# Patient Record
Sex: Female | Born: 2000 | Hispanic: No | Marital: Single | Smoking: Current some day smoker
Health system: Southern US, Community
[De-identification: ages and names within clinical notes are randomized; demographics above are authoritative.]

## PROBLEM LIST (undated history)

## (undated) HISTORY — PX: TYMPANOSTOMY TUBE PLACEMENT: SHX32

## (undated) HISTORY — PX: ADENOIDECTOMY: SUR15

## (undated) HISTORY — PX: OTHER SURGICAL HISTORY: SHX169

---
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
## 2023-08-09 ENCOUNTER — Encounter (HOSPITAL_COMMUNITY): Payer: Self-pay | Admitting: *Deleted

## 2023-08-09 ENCOUNTER — Ambulatory Visit (HOSPITAL_COMMUNITY)
Admission: EM | Admit: 2023-08-09 | Discharge: 2023-08-09 | Disposition: A | Discharge status: Home / Self Care | Payer: Medicaid Other | Attending: Emergency Medicine | Admitting: Emergency Medicine

## 2023-08-09 ENCOUNTER — Other Ambulatory Visit: Payer: Self-pay

## 2023-08-09 DIAGNOSIS — N926 Irregular menstruation, unspecified: Secondary | ICD-10-CM

## 2023-08-09 DIAGNOSIS — Z113 Encounter for screening for infections with a predominantly sexual mode of transmission: Secondary | ICD-10-CM

## 2023-08-09 DIAGNOSIS — N898 Other specified noninflammatory disorders of vagina: Secondary | ICD-10-CM

## 2023-08-09 LAB — POCT URINE PREGNANCY: Preg Test, Ur: NEGATIVE

## 2023-08-09 NOTE — Discharge Instructions (Addendum)
We will call you if anything on your swab returns positive. You can also see these results on MyChart. Please abstain from sexual intercourse until your results return.  Please call the OB/GYN clinics to make an appointment for follow-up.  I would call first thing Monday morning to see when their first available is

## 2023-08-09 NOTE — ED Provider Notes (Signed)
[facility]    CSN: [id number] Arrival date & time: 08/09/23  1119     History   Chief Complaint Chief Complaint  Patient presents with   SEXUALLY TRANSMITTED DISEASE    HPI Heidi Kennedy is a 22 y.o. female.  Here for STD testing She reports some vaginal discharge.  Unprotected intercourse but unknown exposure. Denies abd pain or dysuria  Also reports she has had 3 menstrual cycles during this month.  They have lasted 4 to 7 days at a time She does not have a PCP or women's clinic  History reviewed. No pertinent past medical history.  There are no problems to display for this patient.   Past Surgical History:  Procedure Laterality Date   ADENOIDECTOMY     tubes in ears     TYMPANOSTOMY TUBE PLACEMENT      OB History   No obstetric history on file.      Home Medications    Prior to Admission medications   Not on File    Family History Family History  Problem Relation Age of Onset   Food Allergy Maternal Aunt    Urticaria Neg Hx    Immunodeficiency Neg Hx    Eczema Neg Hx    Allergic rhinitis Neg Hx    Angioedema Neg Hx    Asthma Neg Hx     Social History Social History   Tobacco Use   Smoking status: Some Days   Smokeless tobacco: Never  Substance Use Topics   Alcohol use: No   Drug use: No     Allergies   Patient has no known allergies.   Review of Systems Review of Systems Per HPI  Physical Exam Triage Vital Signs ED Triage Vitals  Encounter Vitals Group     BP 08/09/23 1209 112/74     Systolic BP Percentile --      Diastolic BP Percentile --      Pulse Rate 08/09/23 1209 93     Resp 08/09/23 1209 16     Temp 08/09/23 1209 98.6 F (37 C)     Temp src --      SpO2 08/09/23 1209 98 %     Weight --      Height --      Head Circumference --      Peak Flow --      Pain Score 08/09/23 1206 0     Pain Loc --      Pain Education --      Exclude from Growth Chart --    No data found.  Updated Vital Signs BP  112/74   Pulse 93   Temp 98.6 F (37 C)   Resp 16   LMP 08/01/2023 (Approximate)   SpO2 98%   Physical Exam Vitals and nursing note reviewed.  Constitutional:      General: She is not in acute distress.    Appearance: Normal appearance. She is not ill-appearing.  HENT:     Mouth/Throat:     Mouth: Mucous membranes are moist.     Pharynx: Oropharynx is clear.  Cardiovascular:     Rate and Rhythm: Normal rate and regular rhythm.     Pulses: Normal pulses.     Heart sounds: Normal heart sounds.  Pulmonary:     Effort: Pulmonary effort is normal.     Breath sounds: Normal breath sounds.  Abdominal:     Palpations: Abdomen is soft.     Tenderness: There is  no abdominal tenderness.  Skin:    General: Skin is warm and dry.     Coloration: Skin is not pale.  Neurological:     Mental Status: She is alert and oriented to person, place, and time.      UC Treatments / Results  Labs (all labs ordered are listed, but only abnormal results are displayed) Labs Reviewed  POCT URINE PREGNANCY  CERVICOVAGINAL ANCILLARY ONLY    EKG   Radiology No results found.  Procedures Procedures (including critical care time)  Medications Ordered in UC Medications - No data to display  Initial Impression / Assessment and Plan / UC Course  I have reviewed the triage vital signs and the nursing notes.  Pertinent labs & imaging results that were available during my care of the patient were reviewed by me and considered in my medical decision making (see chart for details).  UPT negative Cytology swab pending. Treat positive as indicated. Provided with two clinics for ob/gyn follow up Can return with any concerns  Patient agreeable to plan, no questions at this time  Final Clinical Impressions(s) / UC Diagnoses   Final diagnoses:  Screen for STD (sexually transmitted disease)  Vaginal discharge  Irregular menses     Discharge Instructions      We will call you if anything on  your swab returns positive. You can also see these results on MyChart. Please abstain from sexual intercourse until your results return.  Please call the OB/GYN clinics to make an appointment for follow-up.  I would call first thing Monday morning to see when their first available is    ED Prescriptions   None    PDMP not reviewed this encounter.   Ronen Bromwell, Lurena Joiner, [place] 08/09/23 1303

## 2023-08-09 NOTE — ED Triage Notes (Signed)
Pt reports she is here for her Girl's check up. Pt has had her menses 3 x this month.

## 2023-08-11 LAB — CERVICOVAGINAL ANCILLARY ONLY
Bacterial Vaginitis (gardnerella): POSITIVE — AB
Candida Glabrata: NEGATIVE
Candida Vaginitis: NEGATIVE
Chlamydia: POSITIVE — AB
Comment: NEGATIVE
Comment: NEGATIVE
Comment: NEGATIVE
Comment: NEGATIVE
Comment: NEGATIVE
Comment: NORMAL
Neisseria Gonorrhea: NEGATIVE
Trichomonas: NEGATIVE

## 2023-08-12 ENCOUNTER — Telehealth: Payer: Self-pay

## 2023-08-12 MED ORDER — METRONIDAZOLE 500 MG PO TABS: 500.0000 mg | ORAL_TABLET | Freq: Two times a day (BID) | ORAL | 0 refills | Status: AC

## 2023-08-12 MED ORDER — DOXYCYCLINE HYCLATE 100 MG PO CAPS: 100.0000 mg | ORAL_CAPSULE | Freq: Two times a day (BID) | ORAL | 0 refills | Status: AC

## 2023-09-10 ENCOUNTER — Ambulatory Visit: Payer: Medicaid Other

## 2023-10-02 ENCOUNTER — Ambulatory Visit: Payer: Medicaid Other | Admitting: Obstetrics and Gynecology

## 2024-03-12 ENCOUNTER — Emergency Department (HOSPITAL_COMMUNITY)
Admission: EM | Admit: 2024-03-12 | Discharge: 2024-03-12 | Disposition: A | Discharge status: Home / Self Care | Payer: MEDICAID | Attending: Emergency Medicine | Admitting: Emergency Medicine

## 2024-03-12 ENCOUNTER — Other Ambulatory Visit: Payer: Self-pay

## 2024-03-12 ENCOUNTER — Encounter (HOSPITAL_COMMUNITY): Payer: Self-pay

## 2024-03-12 DIAGNOSIS — R220 Localized swelling, mass and lump, head: Secondary | ICD-10-CM | POA: Diagnosis present

## 2024-03-12 DIAGNOSIS — L720 Epidermal cyst: Secondary | ICD-10-CM | POA: Diagnosis not present

## 2024-03-12 DIAGNOSIS — L729 Follicular cyst of the skin and subcutaneous tissue, unspecified: Secondary | ICD-10-CM

## 2024-03-12 NOTE — Discharge Instructions (Signed)
 Please follow-up with a dermatologist in regards to recent symptoms and ER visit.  As discussed it appears that you have a cyst on your forehead that will need to be drained by them.  You may take Tylenol or ibuprofen  every 6 hours needed for pain along with icing to help decrease the swelling.  If symptoms change or worsen please return to the ER.

## 2024-03-12 NOTE — ED Provider Notes (Signed)
 [place] EMERGENCY DEPARTMENT AT [facility] Provider Note   CSN: [id number] Arrival date & time: 03/12/24  1254     History  Chief Complaint  Patient presents with   Head Injury    Heidi Kennedy is a 23 y.o. female with no pertinent past medical history presented with a bump on her head since December.  Patient states that she hit her head on a door and has been evaluated in an outside facility and has been seen by her primary care provider states that she has a cyst on her forehead.  Patient denies any fevers, skin color changes, purulent material, discharge.  Patient states that she thinks it is getting bigger.  Patient does not take any medications for it.  Patient was encouraged to follow-up with dermatology however has not seen them yet.  Home Medications Prior to Admission medications   Medication Sig Start Date End Date Taking? Authorizing Provider  metroNIDAZOLE  (FLAGYL ) 500 MG tablet Take 1 tablet (500 mg total) by mouth 2 (two) times daily. 08/12/23   Lamptey, Donley Furth, MD      Allergies    Patient has no known allergies.    Review of Systems   Review of Systems  Physical Exam Updated Vital Signs BP 117/75 (BP Location: Left Arm)   Pulse 77   Temp 97.8 F (36.6 C) (Oral)   Resp 18   Ht 4\' 11"  (1.499 m)   Wt 43.1 kg   LMP 03/05/2024 (Approximate)   SpO2 100%   BMI 19.19 kg/m  Physical Exam Constitutional:      General: She is not in acute distress.    Comments: Resting comfortably on phone  HENT:     Head:     Comments: 1 cm simple cyst noted in the middle of patient's forehead that does not have any overlying warmth, skin color changes, discharge; cyst is rubbery ball-like in structure and nontender when I press Eyes:     Extraocular Movements: Extraocular movements intact.     Conjunctiva/sclera: Conjunctivae normal.     Pupils: Pupils are equal, round, and reactive to light.  Neurological:     Mental Status: She is alert.     ED  Results / Procedures / Treatments   Labs (all labs ordered are listed, but only abnormal results are displayed) Labs Reviewed - No data to display  EKG None  Radiology No results found.  Procedures Procedures    Medications Ordered in ED Medications - No data to display  ED Course/ Medical Decision Making/ A&P                                 Medical Decision Making  Heidi Kennedy 23 y.o. presented today for bump on forehead. Working DDx that I considered at this time includes, but not limited to, cyst, tumor, abscess.  R/o DDx: Tumor, abscess: These are considered less likely due to history of present illness, physical exam, labs/imaging findings  Review of prior external notes: 01/25/2024 office visit  Unique Tests and My Independent Interpretation: None  Social Determinants of Health: none  Discussion with Independent Historian: None  Discussion of Management of Tests: None  Risk: Low: based on diagnostic testing/clinical impression and treatment plan  Risk Stratification Score: None  Plan: On exam patient was no acute distress with stable vitals.  Patient's exam does show simple cyst as there is a rubber ball like structure in  the middle of her forehead without overlying skin color changes or signs of infection.  Cyst was nontender when I press.  Upon chart review of external records patient has seen a primary care provider and was referred to dermatology for further evaluation however has not been able to follow-up with them.  Patient I had shared decision making and agreed to forego any labs or imaging as we both agree this is not an infection and this would be not beneficial.  Will have patient follow-up with dermatology.  I did recommend anti-inflammatories such as Tylenol  or ibuprofen  every 6 hours along with ice to help with your symptoms.  I discussed with the patient that since she hit her head in December and she reports that she has been evaluated for this fall  and given physical exam do not believe she has any acute intracranial pathology to which patient agrees.  Patient states she is comfortable being discharged from triage.  Patient was given return precautions. Patient stable for discharge at this time.  Patient verbalized understanding of plan.  This chart was dictated using voice recognition software.  Despite best efforts to proofread,  errors can occur which can change the documentation meaning.         Final Clinical Impression(s) / ED Diagnoses Final diagnoses:  Cyst of skin    Rx / DC Orders ED Discharge Orders          Ordered    Ambulatory referral to Dermatology        03/12/24 1513              Zhaire Locker T, PA-C 03/12/24 1518    Nicklas Barns, MD 03/15/24 [phone]

## 2024-03-12 NOTE — ED Triage Notes (Signed)
 Pt states she was hit with a door on forehead 4 months ago. Pt states she had a knot on forehead then but since it has gotten bigger and more painful.

## 2024-03-30 ENCOUNTER — Emergency Department (HOSPITAL_COMMUNITY): Payer: MEDICAID

## 2024-03-30 ENCOUNTER — Other Ambulatory Visit: Payer: Self-pay

## 2024-03-30 ENCOUNTER — Emergency Department (HOSPITAL_COMMUNITY)
Admission: EM | Admit: 2024-03-30 | Discharge: 2024-03-30 | Disposition: A | Discharge status: Home / Self Care | Payer: MEDICAID | Attending: Emergency Medicine | Admitting: Emergency Medicine

## 2024-03-30 ENCOUNTER — Encounter (HOSPITAL_COMMUNITY): Payer: Self-pay | Admitting: Pharmacy Technician

## 2024-03-30 DIAGNOSIS — W540XXA Bitten by dog, initial encounter: Secondary | ICD-10-CM | POA: Diagnosis not present

## 2024-03-30 DIAGNOSIS — S51832A Puncture wound without foreign body of left forearm, initial encounter: Secondary | ICD-10-CM | POA: Diagnosis not present

## 2024-03-30 DIAGNOSIS — S51032A Puncture wound without foreign body of left elbow, initial encounter: Secondary | ICD-10-CM | POA: Diagnosis present

## 2024-03-30 DIAGNOSIS — T07XXXA Unspecified multiple injuries, initial encounter: Secondary | ICD-10-CM

## 2024-03-30 DIAGNOSIS — Z23 Encounter for immunization: Secondary | ICD-10-CM | POA: Diagnosis not present

## 2024-03-30 MED ORDER — ONDANSETRON 4 MG PO TBDP: 4.0000 mg | ORAL_TABLET | Freq: Three times a day (TID) | ORAL | 0 refills | Status: AC | PRN

## 2024-03-30 MED ORDER — AMOXICILLIN-POT CLAVULANATE 875-125 MG PO TABS: 1.0000 | ORAL_TABLET | Freq: Two times a day (BID) | ORAL | 0 refills | Status: AC

## 2024-03-30 MED ORDER — TETANUS-DIPHTH-ACELL PERTUSSIS 5-2.5-18.5 LF-MCG/0.5 IM SUSY
0.5000 mL | PREFILLED_SYRINGE | Freq: Once | INTRAMUSCULAR | Status: AC
  Administered 2024-03-30: 0.5 mL via INTRAMUSCULAR
  Filled 2024-03-30: qty 0.5

## 2024-03-30 MED ORDER — SODIUM CHLORIDE 0.9 % IV BOLUS
1000.0000 mL | Freq: Once | INTRAVENOUS | Status: AC
  Administered 2024-03-30: 1000 mL via INTRAVENOUS

## 2024-03-30 MED ORDER — OXYCODONE-ACETAMINOPHEN 5-325 MG PO TABS: 1.0000 | ORAL_TABLET | ORAL | 0 refills | Status: AC | PRN

## 2024-03-30 MED ORDER — ONDANSETRON HCL 4 MG/2ML IJ SOLN
4.0000 mg | Freq: Once | INTRAMUSCULAR | Status: AC
  Administered 2024-03-30: 4 mg via INTRAVENOUS
  Filled 2024-03-30: qty 2

## 2024-03-30 MED ORDER — AMOXICILLIN-POT CLAVULANATE 875-125 MG PO TABS
1.0000 | ORAL_TABLET | Freq: Once | ORAL | Status: AC
  Administered 2024-03-30: 1 via ORAL
  Filled 2024-03-30: qty 1

## 2024-03-30 MED ORDER — MORPHINE SULFATE (PF) 4 MG/ML IV SOLN
4.0000 mg | Freq: Once | INTRAVENOUS | Status: AC
  Administered 2024-03-30: 4 mg via INTRAVENOUS
  Filled 2024-03-30: qty 1

## 2024-03-30 NOTE — Discharge Instructions (Signed)
 Your history, exam, workup today are consistent with multiple puncture bite wounds from the dog that happened earlier today.  X-rays did not show convincing evidence of bony injury or penetration into the joint.  I spoke with Dr. Marce Sensing with hand surgery on-call who did not feel we needed to inject the joint or that she needed acute surgery today.  The x-ray did not show any fractures or tooth fragments.  He recommended cleaning the wound, dressing the wound, and having you change the dressing daily until you can see him in clinic next week.  Please call his office tomorrow to try to get seen next Monday or sooner if you start to notice evidence of infection.  If symptoms are to change acutely, please return to the nearest emergency department.  Please the pain medicine, nausea medicine to help with symptoms and use the antibiotics to prevent infection.  Your tetanus shot was updated.  Please call animal control and let them know so that they can observe the dog in question for the next 10 days.  As the dog is known and under control, pharmacy agreed that you did not need acute rabies injections today.  If that was to change, return to the nearest emergency department.

## 2024-03-30 NOTE — ED Provider Notes (Signed)
 [place] EMERGENCY DEPARTMENT AT [facility] Provider Note   CSN: [id number] Arrival date & time: 03/30/24  1513     History  Chief Complaint  Patient presents with   Animal Bite    Heidi Kennedy is a 23 y.o. female.  The history is provided by the patient and medical records. No language interpreter was used.  Animal Bite Contact animal:  Dog Location:  Shoulder/arm Shoulder/arm injury location:  L elbow and L forearm Time since incident:  3 hours Pain details:    Quality:  Aching   Severity:  Severe   Timing:  Constant   Progression:  Unchanged Incident location:  Outside Provoked: unprovoked   Notifications:  None Animal's rabies vaccination status:  Unknown Animal captured: is a neighbor;s dog.   Tetanus status:  Unknown Relieved by:  Nothing Worsened by:  Nothing Ineffective treatments:  None tried Associated symptoms: swelling   Associated symptoms: no fever, no numbness and no rash        Home Medications Prior to Admission medications   Medication Sig Start Date End Date Taking? Authorizing Provider  metroNIDAZOLE  (FLAGYL ) 500 MG tablet Take 1 tablet (500 mg total) by mouth 2 (two) times daily. 08/12/23   Lamptey, Donley Furth, MD      Allergies    Patient has no known allergies.    Review of Systems   Review of Systems  Constitutional:  Negative for chills, fatigue and fever.  HENT:  Negative for congestion.   Respiratory:  Negative for cough, chest tightness, shortness of breath and wheezing.   Cardiovascular:  Negative for chest pain.  Gastrointestinal:  Negative for abdominal pain, constipation, diarrhea, nausea and vomiting.  Genitourinary:  Negative for dysuria.  Musculoskeletal:  Negative for back pain, neck pain and neck stiffness.  Skin:  Positive for wound. Negative for rash.  Neurological:  Negative for light-headedness, numbness and headaches.  Psychiatric/Behavioral:  Negative for agitation and confusion.   All other systems  reviewed and are negative.   Physical Exam Updated Vital Signs BP 117/84 (BP Location: Right Arm)   Pulse 98   Temp 98.7 F (37.1 C)   Resp 16   LMP 03/05/2024 (Approximate)   SpO2 100%  Physical Exam Vitals and nursing note reviewed.  Constitutional:      General: She is not in acute distress.    Appearance: She is well-developed. She is not ill-appearing, toxic-appearing or diaphoretic.  HENT:     Head: Normocephalic and atraumatic.     Nose: No congestion or rhinorrhea.     Mouth/Throat:     Mouth: Mucous membranes are dry.     Pharynx: No oropharyngeal exudate or posterior oropharyngeal erythema.  Eyes:     Conjunctiva/sclera: Conjunctivae normal.     Pupils: Pupils are equal, round, and reactive to light.  Cardiovascular:     Rate and Rhythm: Normal rate and regular rhythm.     Heart sounds: No murmur heard. Pulmonary:     Effort: Pulmonary effort is normal. No respiratory distress.     Breath sounds: Normal breath sounds. No wheezing, rhonchi or rales.  Chest:     Chest wall: No tenderness.  Abdominal:     General: Abdomen is flat.     Palpations: Abdomen is soft.     Tenderness: There is no abdominal tenderness.  Musculoskeletal:        General: Swelling, tenderness and signs of injury present.     Cervical back: Neck supple.  Skin:  General: Skin is warm and dry.     Capillary Refill: Capillary refill takes less than 2 seconds.     Findings: No erythema.  Neurological:     General: No focal deficit present.     Mental Status: She is alert.     Sensory: No sensory deficit.     Motor: No weakness.  Psychiatric:        Mood and Affect: Mood normal.         ED Results / Procedures / Treatments   Labs (all labs ordered are listed, but only abnormal results are displayed) Labs Reviewed - No data to display  EKG None  Radiology DG Forearm Left Result Date: 03/30/2024 CLINICAL DATA:  dog bite. EXAM: LEFT FOREARM - 2 VIEW; LEFT ELBOW - COMPLETE 3+  VIEW COMPARISON:  None Available. FINDINGS: No acute fracture or dislocation. No aggressive osseous lesion. No significant arthritis of imaged joints. No radiopaque foreign bodies. Several 1-2 mm sized radiolucent foci in the soft tissue around the middle third forearm may represent air due to puncture wound. Soft tissues are within normal limits. IMPRESSION: *No acute osseous abnormality of the left elbow or forearm. No radiopaque foreign bodies. Electronically Signed   By: Beula Brunswick M.D.   On: 03/30/2024 16:25   DG Elbow Complete Left Result Date: 03/30/2024 CLINICAL DATA:  dog bite. EXAM: LEFT FOREARM - 2 VIEW; LEFT ELBOW - COMPLETE 3+ VIEW COMPARISON:  None Available. FINDINGS: No acute fracture or dislocation. No aggressive osseous lesion. No significant arthritis of imaged joints. No radiopaque foreign bodies. Several 1-2 mm sized radiolucent foci in the soft tissue around the middle third forearm may represent air due to puncture wound. Soft tissues are within normal limits. IMPRESSION: *No acute osseous abnormality of the left elbow or forearm. No radiopaque foreign bodies. Electronically Signed   By: Beula Brunswick M.D.   On: 03/30/2024 16:25    Procedures Procedures    Medications Ordered in ED Medications  sodium chloride  0.9 % bolus 1,000 mL (0 mLs Intravenous Stopped 03/30/24 1740)  morphine  (PF) 4 MG/ML injection 4 mg (4 mg Intravenous Given 03/30/24 1642)  ondansetron  (ZOFRAN ) injection 4 mg (4 mg Intravenous Given 03/30/24 1640)  Tdap (BOOSTRIX) injection 0.5 mL (0.5 mLs Intramuscular Given 03/30/24 1741)  amoxicillin -clavulanate (AUGMENTIN ) 875-125 MG per tablet 1 tablet (1 tablet Oral Given 03/30/24 1740)    ED Course/ Medical Decision Making/ A&P                                 Medical Decision Making Amount and/or Complexity of Data Reviewed Radiology: ordered.  Risk Prescription drug management.    Heidi Kennedy is a 23 y.o. right handed female with no significant  past medical history who presents for dog bite.  According to patient, she was outside and a neighbors pitbull dog came and grabbed her on the left elbow and dragged her on the ground.  She reports immediate onset of pain and multiple puncture wounds.  She was able to get away from the dog without other injuries.  She is denying any pain other places and denies hitting her head, neck, chest, back, or torso.  Denies any pain in her legs or her right arm.  She is right-handed.  She is unsure of her last tetanus shot and is unsure of the dog's shot history however the dog is in possession by the owners in the neighborhood.  She reports no preceding symptoms before the attack including no recent fevers, chills, cough, chest pain, shortness breath, or other complaints.  She is reporting some nausea with the pain that is 10 out of 10 in severity.  Patient reports that she is unable to straighten it due to the pain but she is able to move her fingers.  On my exam, she has approximately 10 small puncture wounds primarily near the elbow and one slightly down the forearm.  They are all oozing blood with no pulsatile bleeding.  Distally she has intact cap refill, sensation, and strength is limited by pain but she can move her fingers in all directions.  She did not have wrist drop and was able to move her wrist.  She had some pain with supination of the wrist and had pain when I tried to extend her elbow joint.  She does have some swelling at the proximal forearm near the elbow.  She has no tenderness in her shoulder or in the hand itself.  Lungs were clear and breath sounds are symmetric bilaterally.  Chest and back nontender.  No other injury seen.  Patient had x-rays in triage that did not show any evidence of acute fracture.  It also showed evidence of arthritis and no evidence of large joint effusion which is reassuring.  There are also no tooth fragments.  Due to location of injury, will call hand surgery for  guidance however anticipate tetanus update, starting Augmentin , and having the patient speak with Antony Baumgartner, no trouble to watch the animal to make sure she does not the other tetanus shots.  Given the reassuring x-ray, will discuss with hand surgery they feel she needs loading of the joint versus antibiotics, pain management, and close follow-up.  5:02 PM Spoke to Dr. Marce Sensing with [facility] who reviewed the case and images and x-rays.  He feels the patient does not need loading of the joint either and is appropriate for washing, dressing, using a shoulder sling for comfort, and doing Augmentin  for the next week and follow-up in clinic next Monday or sooner if it starts to worsen.  Patient agrees with this plan.  Will let her finish the fluids, get the wound care and dressing supplies and anticipate discharge.  Wounds were cleaned and dressed and patient put in a sling.  I spoke with pharmacy who agreed that since the animal is in the neighbors home and can be observed for 10 days, patient does not need rabies vaccination at this time.  Patient agrees to hold on rabies shots.  She will call animal control to help us  locate observation and understand return precautions and follow-up with orthopedics.  Patient no other questions or concerns and was discharged in good condition.         Final Clinical Impression(s) / ED Diagnoses Final diagnoses:  Dog bite, initial encounter  Multiple puncture wounds    Rx / DC Orders ED Discharge Orders          Ordered    amoxicillin -clavulanate (AUGMENTIN ) 875-125 MG tablet  Every 12 hours        03/30/24 1925    oxyCODONE-acetaminophen (PERCOCET/ROXICET) 5-325 MG tablet  Every 4 hours PRN        03/30/24 1925    ondansetron  (ZOFRAN -ODT) 4 MG disintegrating tablet  Every 8 hours PRN        03/30/24 1925           Clinical Impression: 1. Dog bite, initial  encounter   2. Multiple puncture wounds     Disposition:  Discharge  Condition: Good  I have discussed the results, Dx and Tx plan with the pt(& family if present). He/she/they expressed understanding and agree(s) with the plan. Discharge instructions discussed at great length. Strict return precautions discussed and pt &/or family have verbalized understanding of the instructions. No further questions at time of discharge.    Discharge Medication List as of 03/30/2024  7:27 PM     START taking these medications   Details  amoxicillin -clavulanate (AUGMENTIN ) 875-125 MG tablet Take 1 tablet by mouth every 12 (twelve) hours for 7 days., Starting Tue 03/30/2024, Until Tue 04/06/2024, Normal    ondansetron  (ZOFRAN -ODT) 4 MG disintegrating tablet Take 1 tablet (4 mg total) by mouth every 8 (eight) hours as needed for nausea or vomiting., Starting Tue 03/30/2024, Normal    oxyCODONE-acetaminophen (PERCOCET/ROXICET) 5-325 MG tablet Take 1 tablet by mouth every 4 (four) hours as needed for severe pain (pain score 7-10)., Starting Tue 03/30/2024, Normal        Follow Up: Merrill Abide, MD [address] [place]  [place] [address] [phone]   with hand surgery     Ebenezer Mccaskey, Marine Sia, MD 03/30/24 [phone]

## 2024-03-30 NOTE — ED Triage Notes (Signed)
 Pt here after getting bit multiple times by a dog. Unknown if dog is UTD with vaccinations. Unknown last tetanus. Pt with visible swelling and pain to L forearm/elbow.

## 2024-06-09 ENCOUNTER — Other Ambulatory Visit: Payer: Self-pay

## 2024-06-09 ENCOUNTER — Emergency Department (HOSPITAL_COMMUNITY): Admission: EM | Admit: 2024-06-09 | Discharge: 2024-06-09 | Discharge status: Against Medical Advice

## 2024-06-09 DIAGNOSIS — Z5321 Procedure and treatment not carried out due to patient leaving prior to being seen by health care provider: Secondary | ICD-10-CM | POA: Insufficient documentation

## 2024-06-09 DIAGNOSIS — A64 Unspecified sexually transmitted disease: Secondary | ICD-10-CM | POA: Insufficient documentation

## 2024-06-09 DIAGNOSIS — N898 Other specified noninflammatory disorders of vagina: Secondary | ICD-10-CM | POA: Diagnosis present

## 2024-06-09 LAB — URINALYSIS, ROUTINE W REFLEX MICROSCOPIC
Bilirubin Urine: NEGATIVE
Glucose, UA: NEGATIVE mg/dL
Hgb urine dipstick: NEGATIVE
Ketones, ur: NEGATIVE mg/dL
Leukocytes,Ua: NEGATIVE
Nitrite: NEGATIVE
Protein, ur: NEGATIVE mg/dL
Specific Gravity, Urine: 1.017 (ref 1.005–1.030)
pH: 6 (ref 5.0–8.0)

## 2024-06-09 LAB — POC URINE PREG, ED: Preg Test, Ur: NEGATIVE

## 2024-06-09 NOTE — ED Triage Notes (Signed)
 Pt. Stated, I need to be checked for STD and I need to know if Im pregnant. Ive had vaginal discharge for 2 weeks.

## 2024-06-09 NOTE — ED Notes (Signed)
 No answer when name called for room.

## 2024-06-11 ENCOUNTER — Ambulatory Visit (HOSPITAL_COMMUNITY): Payer: Self-pay

## 2024-06-11 ENCOUNTER — Telehealth (HOSPITAL_COMMUNITY): Payer: Self-pay

## 2024-06-11 LAB — GC/CHLAMYDIA PROBE AMP (~~LOC~~) NOT AT ARMC
Chlamydia: POSITIVE — AB
Comment: NEGATIVE
Comment: NORMAL
Neisseria Gonorrhea: NEGATIVE

## 2024-06-11 MED ORDER — DOXYCYCLINE HYCLATE 100 MG PO CAPS: 100.0000 mg | ORAL_CAPSULE | Freq: Two times a day (BID) | ORAL | 0 refills | Status: AC

## 2024-06-11 NOTE — Telephone Encounter (Signed)
 This patient tested positive for Chlamydia   She has NKDA I have informed the patient of her results and confirmed her pharmacy is correct in her chart. Treatment has been sent per protocol.   Rosealee Concha, RN
